# Patient Record
Sex: Male | Born: 1960 | Race: White | Hispanic: No | Smoking: Former smoker
Health system: Southern US, Community
[De-identification: ages and names within clinical notes are randomized; demographics above are authoritative.]

## PROBLEM LIST (undated history)

## (undated) DIAGNOSIS — C649 Malignant neoplasm of unspecified kidney, except renal pelvis: Secondary | ICD-10-CM

## (undated) HISTORY — PX: TONSILLECTOMY: SUR1361

---

## 2005-11-26 ENCOUNTER — Ambulatory Visit: Payer: Self-pay | Admitting: Urology

## 2006-06-30 ENCOUNTER — Ambulatory Visit: Payer: Self-pay | Admitting: Urology

## 2006-07-27 ENCOUNTER — Ambulatory Visit: Payer: Self-pay | Admitting: Urology

## 2006-09-17 ENCOUNTER — Ambulatory Visit: Payer: Self-pay | Admitting: Urology

## 2006-12-17 ENCOUNTER — Ambulatory Visit: Payer: Self-pay | Admitting: Urology

## 2007-06-30 ENCOUNTER — Ambulatory Visit: Payer: Self-pay | Admitting: Urology

## 2007-12-13 IMAGING — CT CT ABDOMEN WO/W CM
2 of 4 series · 14 of 32 positions shown, 19 images · non-contrast
Comparison: none

REASON FOR EXAM: 6 mo f/u renal cell carcinoma
COMMENTS:

[Series 2: soft tissue w/o · axial · non-contrast · 0.78mm/px · z∈[-708,-468]mm · 7 of 41 slices shown, 12 images]
[im 6/41  soft-tissue]
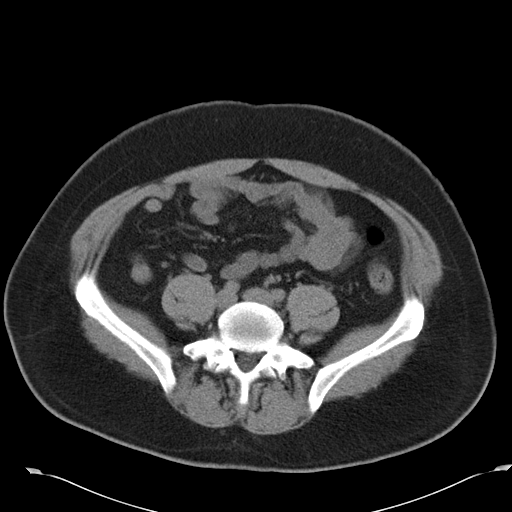
[im 6/41  bone]
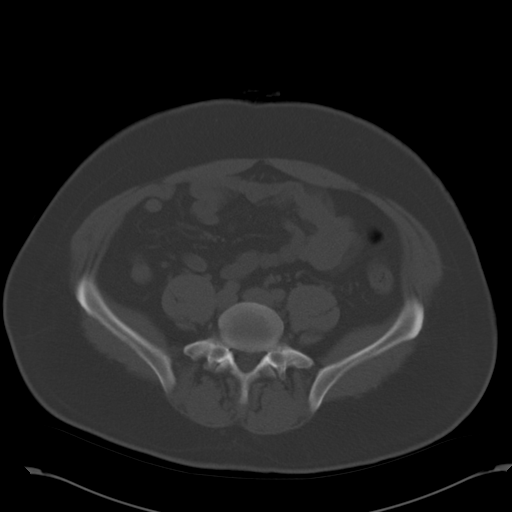
[im 11/41  soft-tissue]
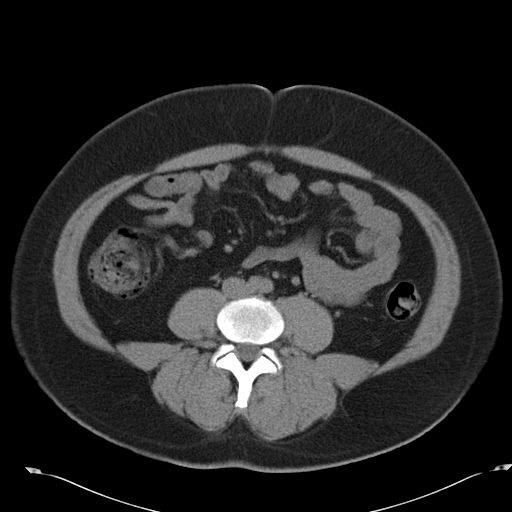
[im 16/41  soft-tissue]
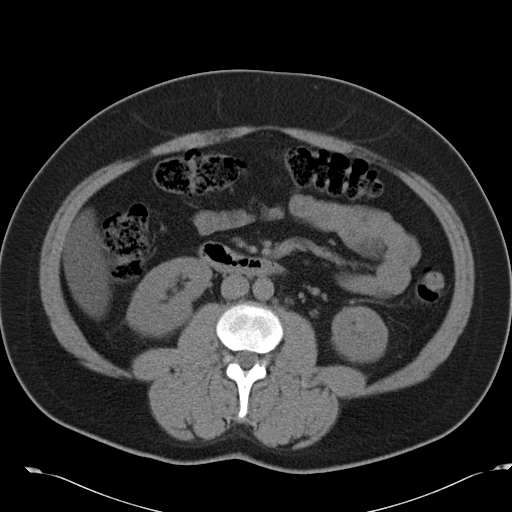
[im 21/41  soft-tissue]
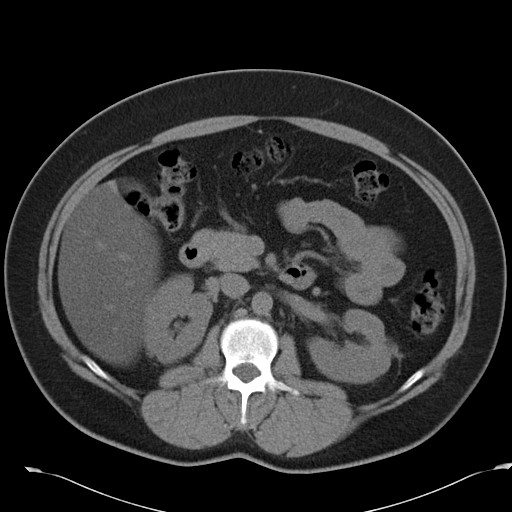
[im 21/41  lung]
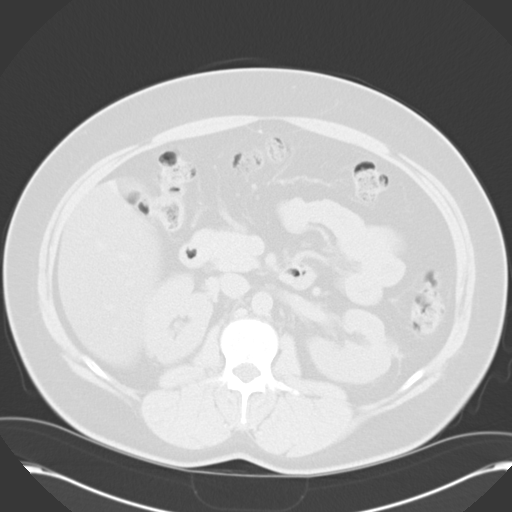
[im 26/41  soft-tissue]
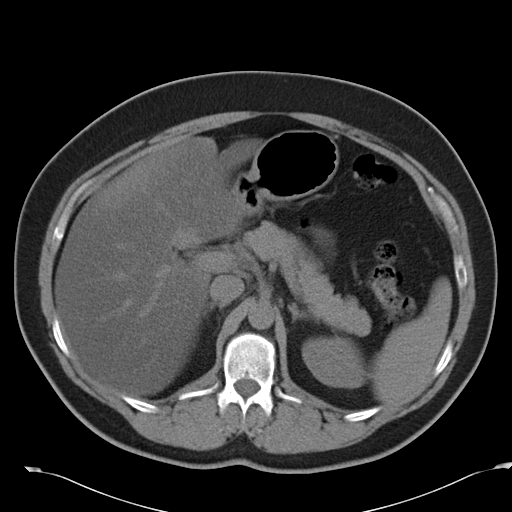
[im 26/41  lung]
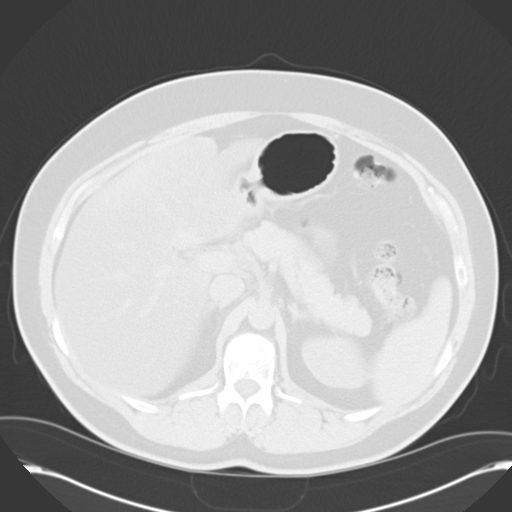
[im 31/41  soft-tissue]
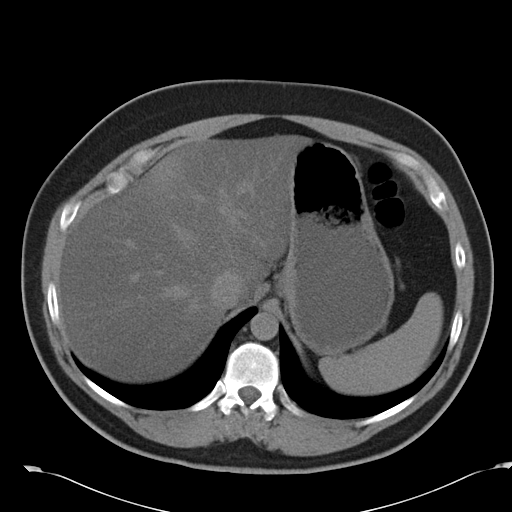
[im 31/41  lung]
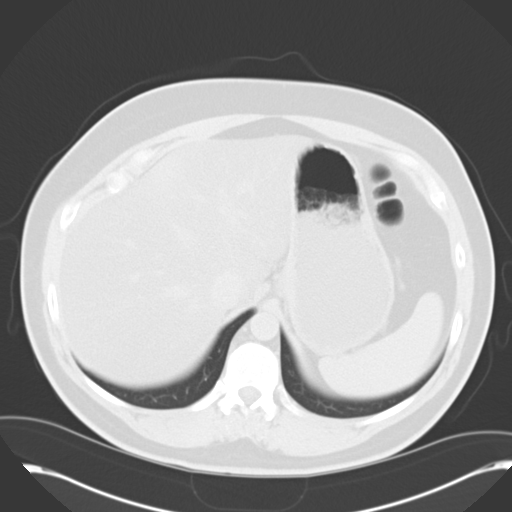
[im 36/41  soft-tissue]
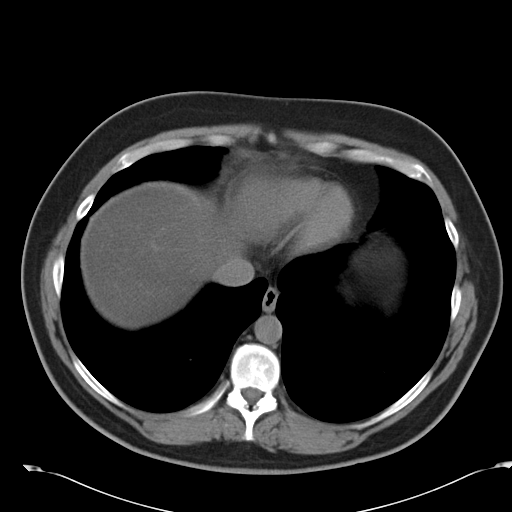
[im 36/41  lung]
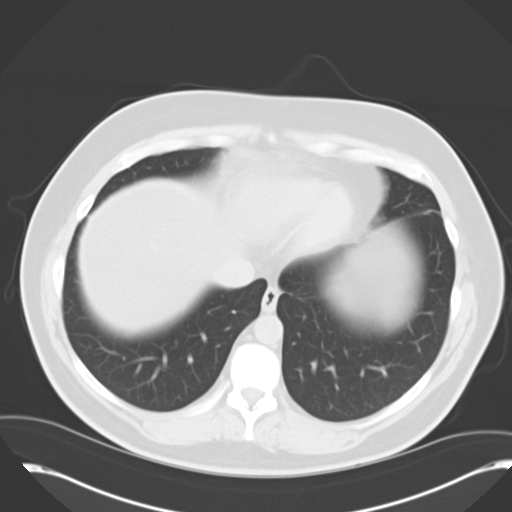

[Series 4: soft tissue with · axial · 0.78mm/px · z∈[-708,-468]mm · 7 of 41 slices shown]
[im 6/41  soft-tissue]
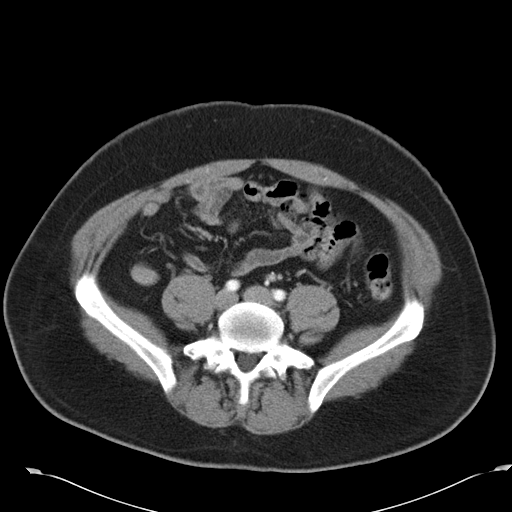
[im 11/41  soft-tissue]
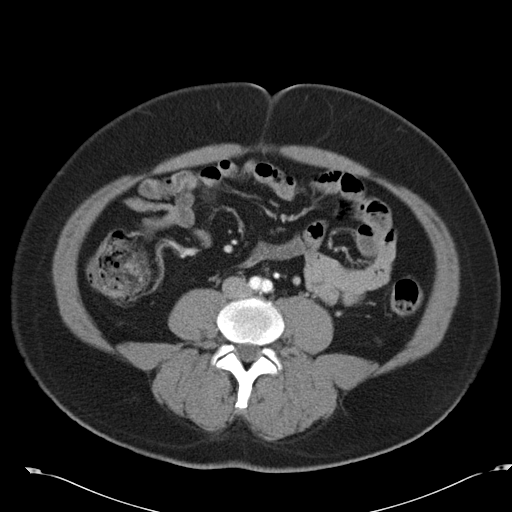
[im 16/41  soft-tissue]
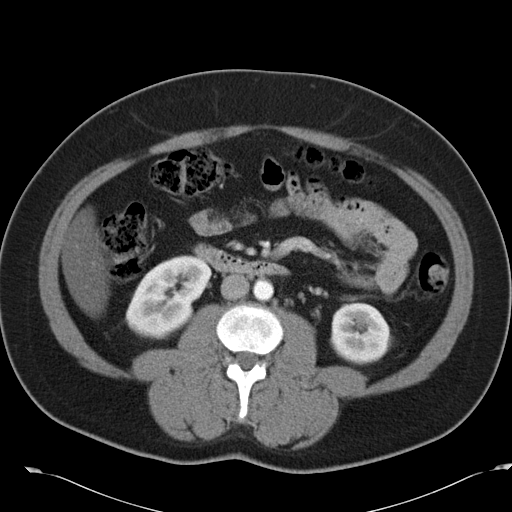
[im 21/41  soft-tissue]
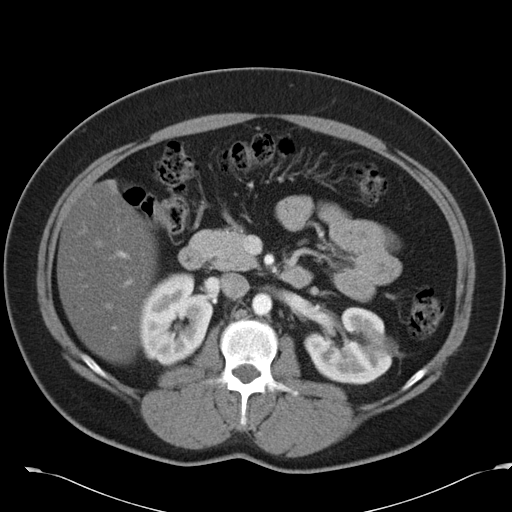
[im 26/41  soft-tissue]
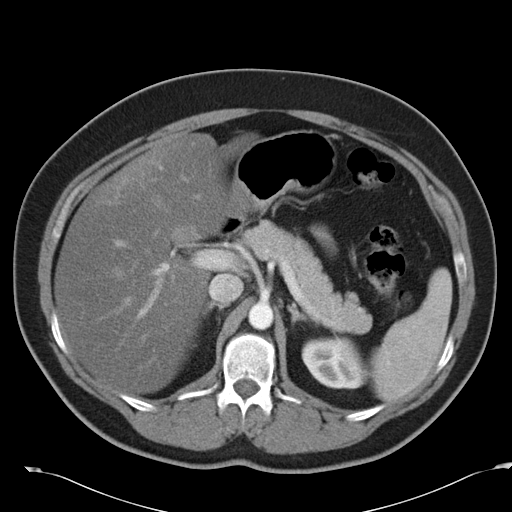
[im 31/41  soft-tissue]
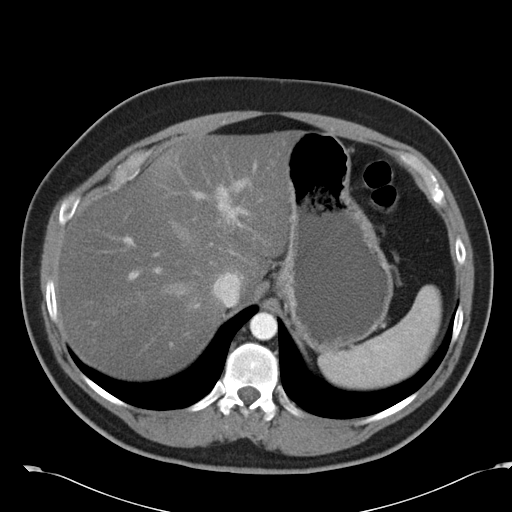
[im 36/41  soft-tissue]
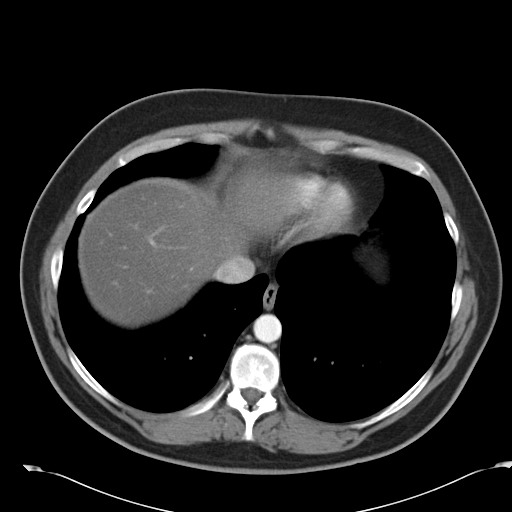

[14 of 32 positions shown; findings below may reference images not displayed]

PROCEDURE:     CT  - CT ABDOMEN STANDARD W/WO  - June 30, 2007  [DATE]

RESULT:     Triphasic CT of the abdomen is compared to the most recent exam
dated 12/17/2006. Comparison is also made images of 09/17/2006. The exophytic
lesion in the midportion of the LEFT kidney on image 21 appears to be
smaller than that seen previously. There is no evidence of enhancement.
There is some adjacent fibrotic stranding. No new renal masses are evident.
There is no hydronephrosis. The kidneys otherwise appear to enhance
normally. There is fatty infiltration the liver. The spleen, pancreas and
adrenal glands are unremarkable. The abdominal aorta is normal in caliber.
There is no abnormal bowel distention. The lung bases are clear. No abnormal
calcification is seen within the kidneys.
IMPRESSION: 1. No evidence of recurrent malignancy.
2. Fatty infiltration of the liver.
3. Post ablation changes in the LEFT kidney as described.

## 2008-01-18 ENCOUNTER — Ambulatory Visit: Payer: Self-pay | Admitting: Urology

## 2008-07-02 IMAGING — US US RENAL KIDNEY
1 series · 17 of 25 positions shown · non-contrast
Comparison: none

REASON FOR EXAM: renal cell CA
COMMENTS:

[Series 1: us renal kidney · 17 of 31 slices shown]
[im 1/31]
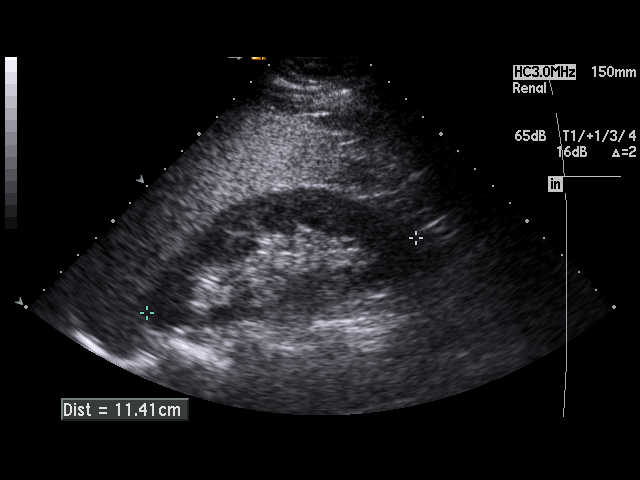
[im 3/31]
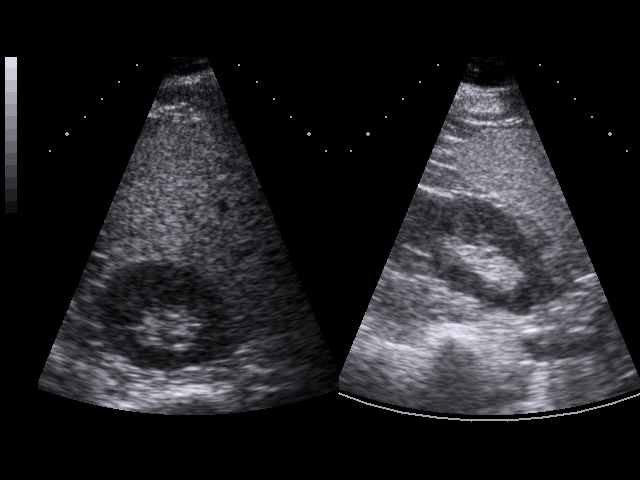
[im 4/31]
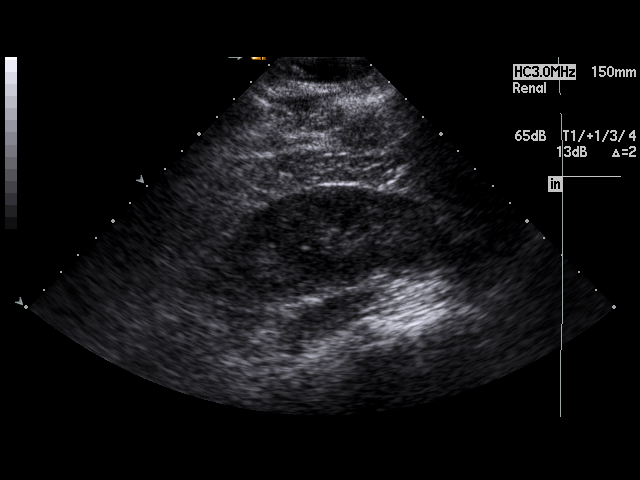
[im 7/31]
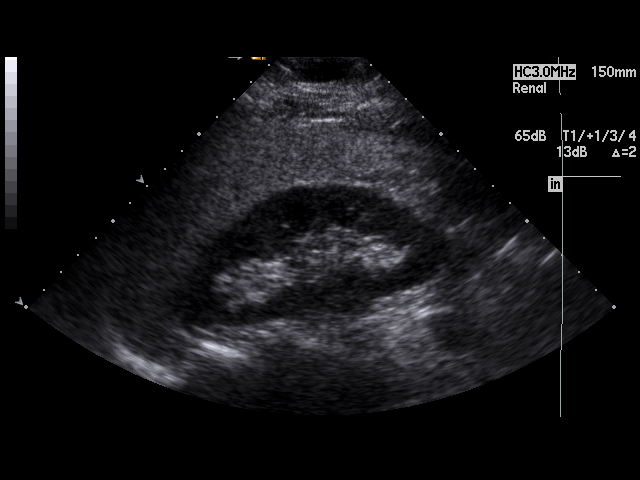
[im 8/31]
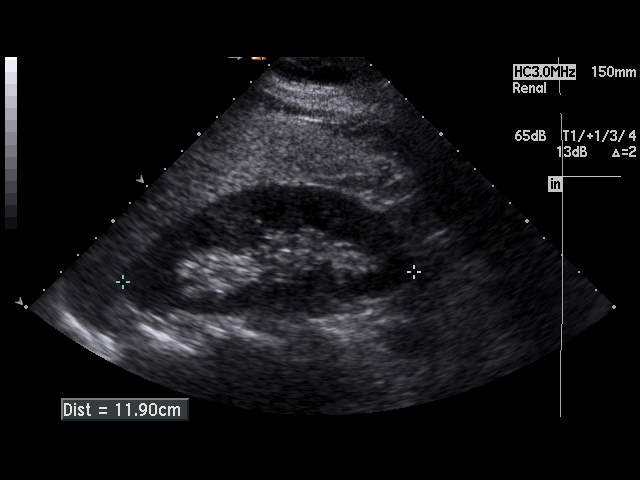
[im 11/31]
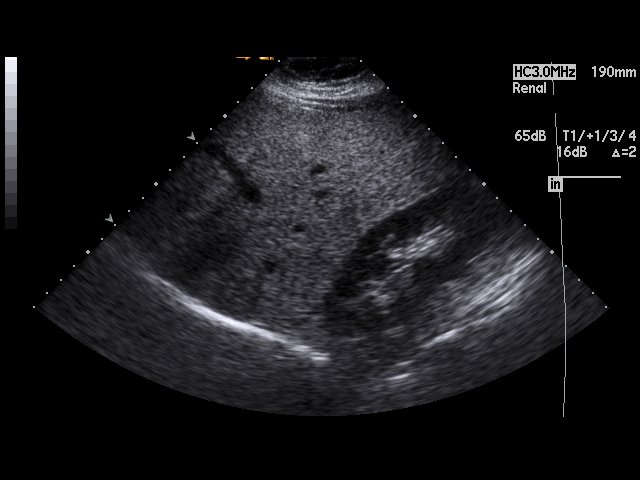
[im 12/31]
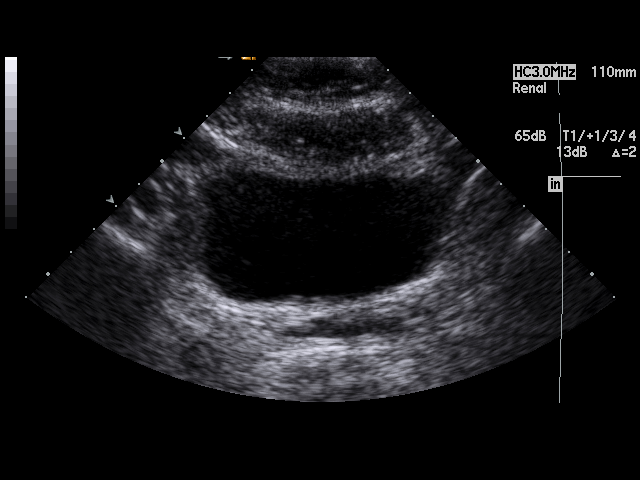
[im 14/31]
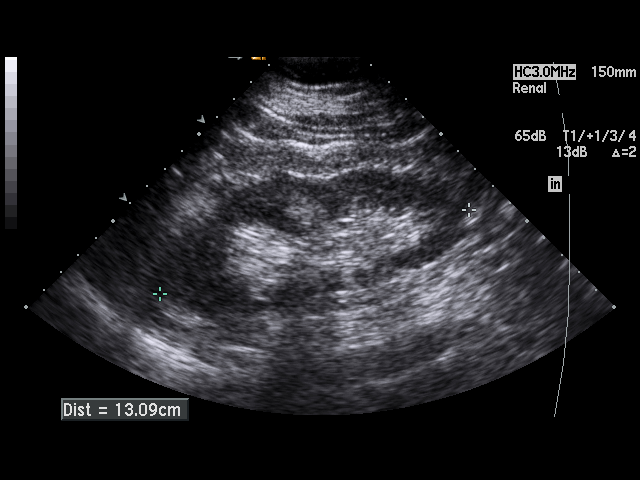
[im 16/31]
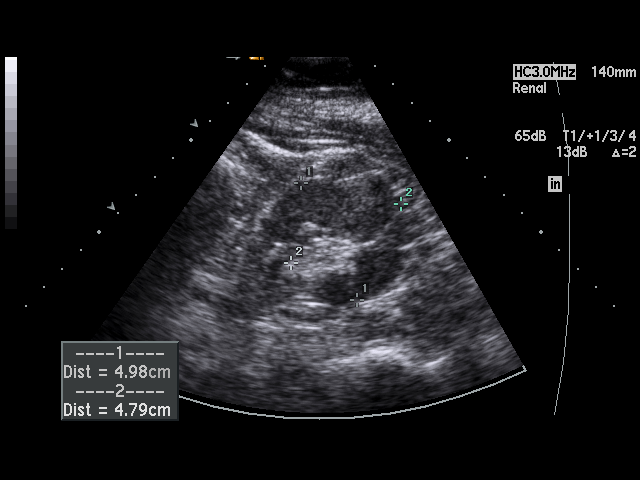
[im 17/31]
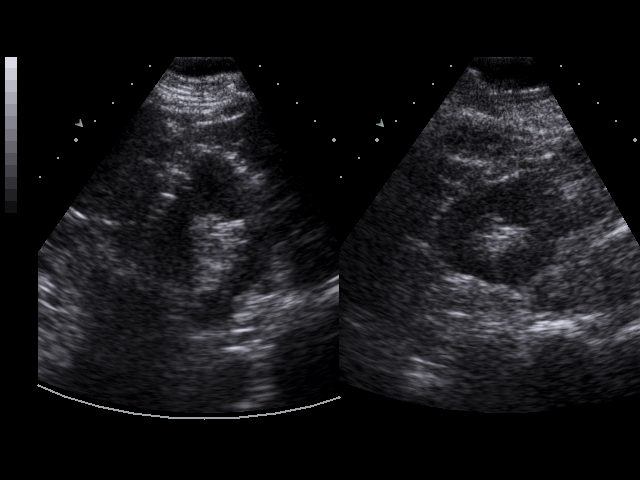
[im 19/31]
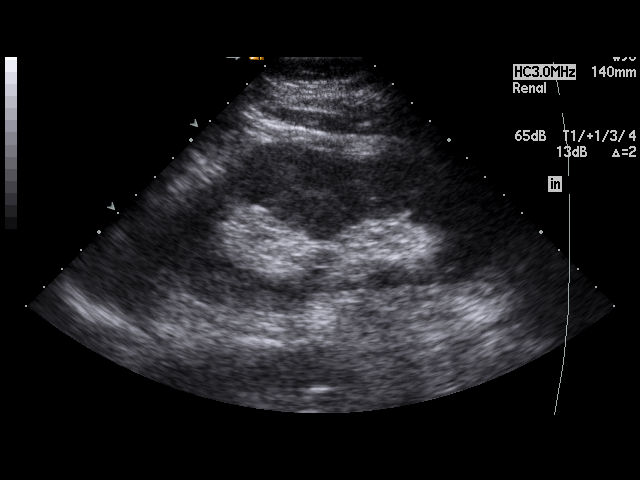
[im 21/31]
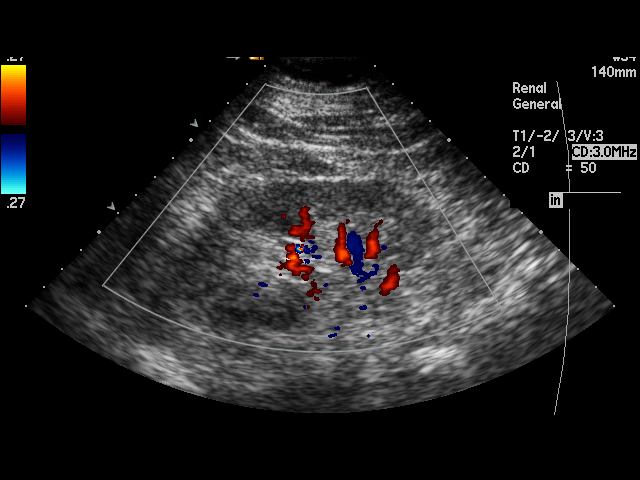
[im 23/31]
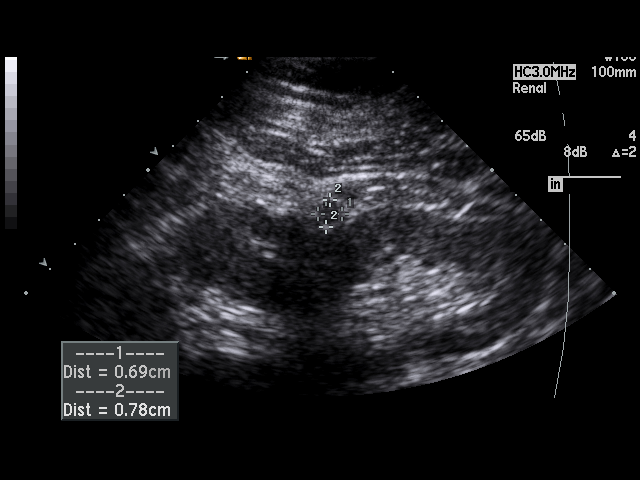
[im 24/31]
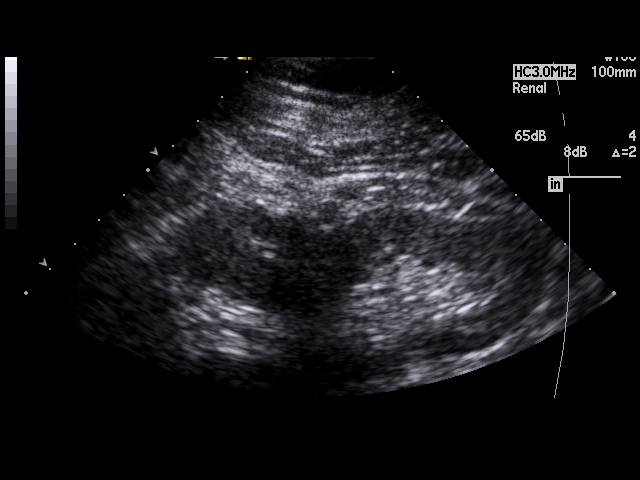
[im 27/31]
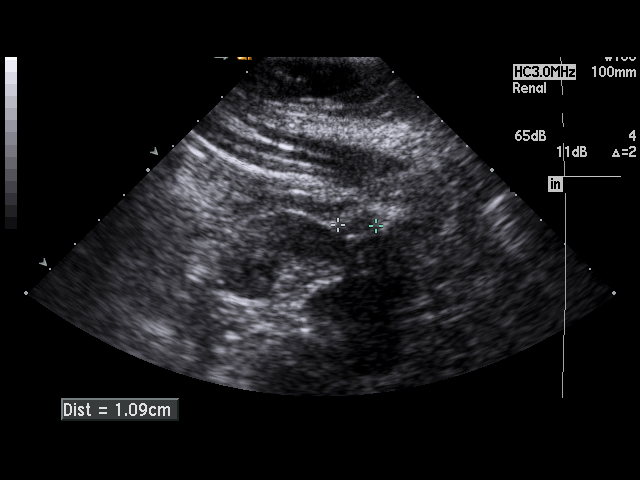
[im 28/31]
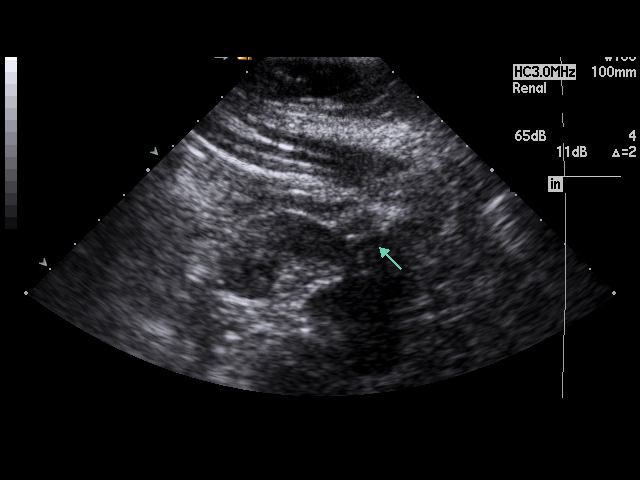
[im 31/31]
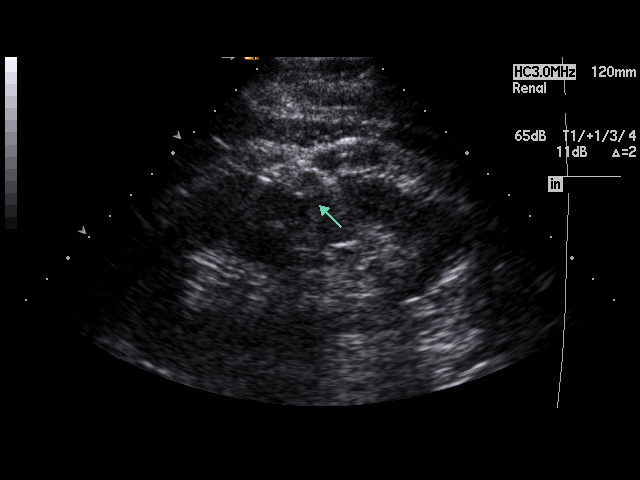

[17 of 25 positions shown; findings below may reference images not displayed]

PROCEDURE:     US  - US KIDNEY  - January 18, 2008 [DATE]

RESULT:     The patient has undergone prior cryoablation for a renal cell
carcinoma involving the LEFT kidney. On the LEFT there does remain area of
subtle decreased echogenicity closely applied to the kidney. This area
measures approximately 7 x 8 x 11 mm in diameter. It does not appear
hypervascular. The LEFT kidney overall measures 13.3 x 5.0 x 4.8 cm.

The RIGHT kidney is normal in echotexture and size measuring 11.9 x 4.7 x
5.1 cm. The partially distended urinary bladder is grossly normal.
IMPRESSION: 1.  There is a small amount of hypoechoic soft tissue adjacent to the LEFT
kidney in the region of the prior cryoablation. When compared to the CT
images from 30 June, 2007 I suspect that there has not been significant
interval change. Direct comparison with a followup CT scan could be
considered as well.
2. I see no abnormality of the RIGHT kidney.

## 2010-01-16 ENCOUNTER — Ambulatory Visit: Payer: Self-pay | Admitting: Urology

## 2010-04-08 ENCOUNTER — Ambulatory Visit: Payer: Self-pay | Admitting: Family Medicine

## 2011-08-21 ENCOUNTER — Ambulatory Visit: Payer: Self-pay | Admitting: Internal Medicine

## 2011-11-05 ENCOUNTER — Ambulatory Visit: Payer: Self-pay

## 2012-08-08 ENCOUNTER — Ambulatory Visit: Payer: Self-pay | Admitting: Emergency Medicine

## 2012-08-08 LAB — URINALYSIS, COMPLETE
Bacteria: NEGATIVE
Bilirubin,UR: NEGATIVE
Glucose,UR: NEGATIVE mg/dL (ref 0–75)
Ketone: NEGATIVE
Nitrite: NEGATIVE
Ph: 6.5 (ref 4.5–8.0)
Protein: NEGATIVE
Specific Gravity: 1.02 (ref 1.003–1.030)

## 2013-07-17 ENCOUNTER — Ambulatory Visit: Payer: Self-pay

## 2013-12-28 ENCOUNTER — Ambulatory Visit: Payer: Self-pay | Admitting: Physician Assistant

## 2015-05-20 ENCOUNTER — Ambulatory Visit
Admission: EM | Admit: 2015-05-20 | Discharge: 2015-05-20 | Disposition: A | Discharge status: Home / Self Care | Payer: BC Managed Care – PPO | Attending: Family Medicine | Admitting: Family Medicine

## 2015-05-20 ENCOUNTER — Encounter: Payer: Self-pay | Admitting: Emergency Medicine

## 2015-05-20 DIAGNOSIS — Z87891 Personal history of nicotine dependence: Secondary | ICD-10-CM | POA: Insufficient documentation

## 2015-05-20 DIAGNOSIS — Z79899 Other long term (current) drug therapy: Secondary | ICD-10-CM | POA: Diagnosis not present

## 2015-05-20 DIAGNOSIS — M549 Dorsalgia, unspecified: Secondary | ICD-10-CM | POA: Insufficient documentation

## 2015-05-20 DIAGNOSIS — N23 Unspecified renal colic: Secondary | ICD-10-CM

## 2015-05-20 DIAGNOSIS — Z85528 Personal history of other malignant neoplasm of kidney: Secondary | ICD-10-CM | POA: Insufficient documentation

## 2015-05-20 DIAGNOSIS — Z87442 Personal history of urinary calculi: Secondary | ICD-10-CM | POA: Diagnosis not present

## 2015-05-20 DIAGNOSIS — R319 Hematuria, unspecified: Secondary | ICD-10-CM | POA: Diagnosis not present

## 2015-05-20 HISTORY — DX: Malignant neoplasm of unspecified kidney, except renal pelvis: C64.9

## 2015-05-20 LAB — URINALYSIS COMPLETE WITH MICROSCOPIC (ARMC ONLY)
Bacteria, UA: NONE SEEN — AB
Glucose, UA: NEGATIVE mg/dL
KETONES UR: NEGATIVE mg/dL
Leukocytes, UA: NEGATIVE
Nitrite: NEGATIVE
SQUAMOUS EPITHELIAL / LPF: NONE SEEN — AB
Specific Gravity, Urine: 1.025 (ref 1.005–1.030)
pH: 5.5 (ref 5.0–8.0)

## 2015-05-20 MED ORDER — TAMSULOSIN HCL 0.4 MG PO CAPS: 0.4000 mg | ORAL_CAPSULE | Freq: Every day | ORAL | Status: DC

## 2015-05-20 MED ORDER — ONDANSETRON HCL 4 MG PO TABS: ORAL_TABLET | ORAL | Status: DC

## 2015-05-20 MED ORDER — KETOROLAC TROMETHAMINE 60 MG/2ML IM SOLN
60.0000 mg | Freq: Once | INTRAMUSCULAR | Status: AC
  Administered 2015-05-20: 60 mg via INTRAMUSCULAR

## 2015-05-20 MED ORDER — IBUPROFEN 800 MG PO TABS: 800.0000 mg | ORAL_TABLET | Freq: Four times a day (QID) | ORAL | Status: DC | PRN

## 2015-05-20 MED ORDER — OXYCODONE-ACETAMINOPHEN 5-325 MG PO TABS: ORAL_TABLET | ORAL | Status: DC

## 2015-05-20 NOTE — ED Provider Notes (Signed)
CSN: [id number]     Arrival date & time 05/20/15  1242 History   First MD Initiated Contact with Patient 05/20/15 1319     Chief Complaint  Patient presents with  . Back Pain   (Consider location/radiation/quality/duration/timing/severity/associated sxs/prior Treatment) HPI  54 yo M awoke early morning with nagging discomfort right flank- no nausea no vomiting. History of known kidney stones -unsure whether R/L/B ?? Having  Right sided pain from mid flank to RLQ, denies any scrotal or testicular pain. Had a kidney cancer on the left in 2007 treated with cryoablation -now with scarring. Followed by Dr Jacqlyn Larsen at [facility]. Took 800 mg Advil at early AM. Fell back to sleep, awoke and ate breakfast. Peristaltic colic pattern has returned and increased over past few hours. Wife is here with patient, Past Medical History  Diagnosis Date  . Renal cell carcinoma    Past Surgical History  Procedure Laterality Date  . Tonsillectomy     History reviewed. No pertinent family history. History  Substance Use Topics  . Smoking status: Former Research scientist (life sciences)  . Smokeless tobacco: Never Used  . Alcohol Use: No    Review of Systems  Review of 10 systems negative for acute change except as referenced in HPI  Allergies  Review of patient's allergies indicates no known allergies.  Home Medications   Prior to Admission medications   Medication Sig Start Date End Date Taking? Authorizing Provider  ibuprofen (ADVIL,MOTRIN) 800 MG tablet Take 1 tablet (800 mg total) by mouth every 6 (six) hours as needed. 05/20/15   Jan Fireman, PA-C  ondansetron (ZOFRAN) 4 MG tablet 1-2  TABLETS BY MOUTH EVERY 6-8 HOURS AS NEEDED FOR NAUSEA 05/20/15   Jan Fireman, PA-C  oxyCODONE-acetaminophen (PERCOCET/ROXICET) 5-325 MG per tablet Take 1-2 tablets by mouth every 6 hours as needed for pain 05/20/15   Jan Fireman, PA-C  tamsulosin (FLOMAX) 0.4 MG CAPS capsule Take 1 capsule (0.4 mg total) by mouth daily. 05/20/15   Jan Fireman, PA-C    BP 145/83 mmHg  Pulse 65  Temp(Src) 98.4 F (36.9 C)  Resp 16  Ht 5\' 10"  (1.778 m)  Wt 215 lb (97.523 kg)  BMI 30.85 kg/m2  SpO2 98% Physical Exam Constitutional -alert and oriented,well appearing and mild distress. Pacing or changing position on the bed Head-atraumatic Eyes- conjunctiva normal, EOMI ,conjugate gaze Nose- no congestion or rhinorrhea Mouth/throat- mucous membranes moist , Neck- supple  CV- regular rate, grossly normal heart sounds,  Resp-no distress, normal respiratory effort,clear to auscultation bilaterally Back- generally non tender, mild increase Right without specific CVAT; peristaltic pain on right GI- soft,non-tender,no distention GU- not examined MSK- no lower extremity tenderness nor edema,no joint effusion, ambulatory Neuro- normal speech and language, no gross focal neurological deficit Skin-warm,dry ,intact; no rash noted Psych-mood and affect grossly normal; speech and behavior grossly normal.   ED Course  Procedures (including critical care time) Labs Review Labs Reviewed  URINALYSIS COMPLETEWITH MICROSCOPIC ([facility] ONLY) - Abnormal; Notable for the following:    APPearance CLOUDY (*)    Bilirubin Urine 1+ (*)    Hgb urine dipstick 3+ (*)    Protein, ur TRACE (*)    Bacteria, UA NONE SEEN (*)    Squamous Epithelial / LPF NONE SEEN (*)    All other components within normal limits   Results for orders placed or performed during the hospital encounter of 05/20/15  Urinalysis complete, with microscopic  Result Value Ref Range   Color, Urine  YELLOW YELLOW   APPearance CLOUDY (A) CLEAR   Glucose, UA NEGATIVE NEGATIVE mg/dL   Bilirubin Urine 1+ (A) NEGATIVE   Ketones, ur NEGATIVE NEGATIVE mg/dL   Specific Gravity, Urine 1.025 1.005 - 1.030   Hgb urine dipstick 3+ (A) NEGATIVE   pH 5.5 5.0 - 8.0   Protein, ur TRACE (A) NEGATIVE mg/dL   Nitrite NEGATIVE NEGATIVE   Leukocytes, UA NEGATIVE NEGATIVE   RBC / HPF TOO NUMEROUS TO COUNT <3 RBC/hpf    WBC, UA 0-5 <3 WBC/hpf   Bacteria, UA NONE SEEN (A) RARE   Squamous Epithelial / LPF NONE SEEN (A) RARE   Mucous PRESENT    Ca Oxalate Crys, UA PRESENT     Imaging Review No results found. Medications  ketorolac (TORADOL) injection 60 mg (60 mg Intramuscular Given 05/20/15 1344)   Much improved after RX - relaxed Drinking water , comfortable MDM   1. Hematuria   2. Personal history of kidney stones   3. Colic, ureteral    Discharge Medication List as of 05/20/2015  2:12 PM    START taking these medications   Details  ibuprofen (ADVIL,MOTRIN) 800 MG tablet Take 1 tablet (800 mg total) by mouth every 6 (six) hours as needed., Starting 05/20/2015, Until Discontinued, Print    ondansetron (ZOFRAN) 4 MG tablet 1-2  TABLETS BY MOUTH EVERY 6-8 HOURS AS NEEDED FOR NAUSEA, Print    oxyCODONE-acetaminophen (PERCOCET/ROXICET) 5-325 MG per tablet Take 1-2 tablets by mouth every 6 hours as needed for pain, Print    tamsulosin (FLOMAX) 0.4 MG CAPS capsule Take 1 capsule (0.4 mg total) by mouth daily., Starting 05/20/2015, Until Discontinued, Print       Plan: 1. Test results and diagnosis reviewed with patient 2. Rx as per orders; risks, benefits, potential side effects reviewed with patient 3. Recommend supportive treatment with NSAIDs as well as Percocet 4. F/u prn if symptoms worsen or don't improve-discussed plan to go to [facility] ER if exacerbation of discomfort. He is due for annual follow up with Urology and due a CT- appropriate to use system he has his history in and with attendings who know him, if this colic doesn't resolve . They live in the area and are very comfortable with the plan 5. Medications as above.  Jan Fireman, PA-C 05/20/15 1507

## 2015-05-20 NOTE — Discharge Instructions (Signed)
Kidney Stones °Kidney stones (urolithiasis) are deposits that form inside your kidneys. The intense pain is caused by the stone moving through the urinary tract. When the stone moves, the ureter goes into spasm around the stone. The stone is usually passed in the urine.  °CAUSES  °· A disorder that makes certain neck glands produce too much parathyroid hormone (primary hyperparathyroidism). °· A buildup of uric acid crystals, similar to gout in your joints. °· Narrowing (stricture) of the ureter. °· A kidney obstruction present at birth (congenital obstruction). °· Previous surgery on the kidney or ureters. °· Numerous kidney infections. °SYMPTOMS  °· Feeling sick to your stomach (nauseous). °· Throwing up (vomiting). °· Blood in the urine (hematuria). °· Pain that usually spreads (radiates) to the groin. °· Frequency or urgency of urination. °DIAGNOSIS  °· Taking a history and physical exam. °· Blood or urine tests. °· CT scan. °· Occasionally, an examination of the inside of the urinary bladder (cystoscopy) is performed. °TREATMENT  °· Observation. °· Increasing your fluid intake. °· Extracorporeal shock wave lithotripsy--This is a noninvasive procedure that uses shock waves to break up kidney stones. °· Surgery may be needed if you have severe pain or persistent obstruction. There are various surgical procedures. Most of the procedures are performed with the use of small instruments. Only small incisions are needed to accommodate these instruments, so recovery time is minimized. °The size, location, and chemical composition are all important variables that will determine the proper choice of action for you. Talk to your health care provider to better understand your situation so that you will minimize the risk of injury to yourself and your kidney.  °HOME CARE INSTRUCTIONS  °· Drink enough water and fluids to keep your urine clear or pale yellow. This will help you to pass the stone or stone fragments. °· Strain  all urine through the provided strainer. Keep all particulate matter and stones for your health care provider to see. The stone causing the pain may be as small as a grain of salt. It is very important to use the strainer each and every time you pass your urine. The collection of your stone will allow your health care provider to analyze it and verify that a stone has actually passed. The stone analysis will often identify what you can do to reduce the incidence of recurrences. °· Only take over-the-counter or prescription medicines for pain, discomfort, or fever as directed by your health care provider. °· Make a follow-up appointment with your health care provider as directed. °· Get follow-up X-rays if required. The absence of pain does not always mean that the stone has passed. It may have only stopped moving. If the urine remains completely obstructed, it can cause loss of kidney function or even complete destruction of the kidney. It is your responsibility to make sure X-rays and follow-ups are completed. Ultrasounds of the kidney can show blockages and the status of the kidney. Ultrasounds are not associated with any radiation and can be performed easily in a matter of minutes. °SEEK MEDICAL CARE IF: °· You experience pain that is progressive and unresponsive to any pain medicine you have been prescribed. °SEEK IMMEDIATE MEDICAL CARE IF:  °· Pain cannot be controlled with the prescribed medicine. °· You have a fever or shaking chills. °· The severity or intensity of pain increases over 18 hours and is not relieved by pain medicine. °· You develop a new onset of abdominal pain. °· You feel faint or pass out. °·   You are unable to urinate. MAKE SURE YOU:   Understand these instructions.  Will watch your condition.  Will get help right away if you are not doing well or get worse. Document Released: 11/16/2005 Document Revised: 07/19/2013 Document Reviewed: 04/19/2013 [facility] Patient Information 2015  [place], [place]. This information is not intended to replace advice given to you by your health care provider. Make sure you discuss any questions you have with your health care provider. Lithotripsy for Kidney Stones Lithotripsy is a treatment that can sometimes help eliminate kidney stones and pain that they cause. A form of lithotripsy, also known as extracorporeal shock wave lithotripsy, is a nonsurgical procedure that helps your body rid itself of the kidney stone when it is too big to pass on its own. Extracorporeal shock wave lithotripsy is a method of crushing a kidney stone with shock waves. These shock waves pass through your body and are focused on your stone. They cause the kidney stones to crumble while still in the urinary tract. It is then easier for the smaller pieces of stone to pass in the urine. Lithotripsy usually takes about an hour. It is done in a hospital, a lithotripsy center, or a mobile unit. It usually does not require an overnight stay. Your health care provider will instruct you on preparation for the procedure. Your health care provider will tell you what to expect afterward. LET [facility] CARE PROVIDER KNOW ABOUT:  Any allergies you have.  All medicines you are taking, including vitamins, herbs, eye drops, creams, and over-the-counter medicines.  Previous problems you or members of your family have had with the use of anesthetics.  Any blood disorders you have.  Previous surgeries you have had.  Medical conditions you have. RISKS AND COMPLICATIONS Generally, lithotripsy for kidney stones is a safe procedure. However, as with any procedure, complications can occur. Possible complications include:  Infection.  Bleeding of the kidney.  Bruising of the kidney or skin.  Obstruction of the ureter.  Failure of the stone to fragment. BEFORE THE PROCEDURE  Do not eat or drink for 6-8 hours prior to the procedure. You may, however, take the medications with a sip  of water that your physician instructs you to take  Do not take aspirin or aspirin-containing products for 7 days prior to your procedure  Do not take nonsteroidal anti-inflammatory products for 7 days prior to your procedure PROCEDURE A stent (flexible tube with holes) may be placed in your ureter. The ureter is the tube that transports the urine from the kidneys to the bladder. Your health care provider may place a stent before the procedure. This will help keep urine flowing from the kidney if the fragments of the stone block the ureter. You may have an IV tube placed in one of your veins to give you fluids and medicines. These medicines may help you relax or make you sleep. During the procedure, you will lie comfortably on a fluid-filled cushion or in a warm-water bath. After an X-ray or ultrasound exam to locate your stone, shock waves are aimed at the stone. If you are awake, you may feel a tapping sensation as the shock waves pass through your body. If large stone particles remain after treatment, a second procedure may be necessary at a later date. For comfort during the test:  Relax as much as possible.  Try to remain still as much as possible.  Try to follow instructions to speed up the test.  Let your health care  provider know if you are uncomfortable, anxious, or in pain. AFTER THE PROCEDURE  After surgery, you will be taken to the recovery area. A nurse will watch and check your progress. Once you're awake, stable, and taking fluids well, you will be allowed to go home as long as there are no problems. You will also be allowed to pass your urine before discharge.You may be given antibiotics to help prevent infection. You may also be prescribed pain medicine if needed. In a week or two, your health care provider may remove your stent, if you have one. You may first have an X-ray exam to check on how successful the fragmentation of your stone has been and how much of the stone has  passed. Your health care provider will check to see whether or not stone particles remain. SEEK IMMEDIATE MEDICAL CARE IF:  You develop a fever or shaking chills.  Your pain is not relieved by medicine.  You feel sick to your stomach (nauseated) and you vomit.  You develop heavy bleeding.  You have difficulty urinating.  You start to pass your stent from your penis. Document Released: 11/13/2000 Document Revised: 09/06/2013 Document Reviewed: 06/01/2013 [facility] Patient Information 2015 [place], [place]. This information is not intended to replace advice given to you by your health care provider. Make sure you discuss any questions you have with your health care provider. Dietary Guidelines to Help Prevent Kidney Stones Your risk of kidney stones can be decreased by adjusting the foods you eat. The most important thing you can do is drink enough fluid. You should drink enough fluid to keep your urine clear or pale yellow. The following guidelines provide specific information for the type of kidney stone you have had. GUIDELINES ACCORDING TO TYPE OF KIDNEY STONE Calcium Oxalate Kidney Stones  Reduce the amount of salt you eat. Foods that have a lot of salt cause your body to release excess calcium into your urine. The excess calcium can combine with a substance called oxalate to form kidney stones.  Reduce the amount of animal protein you eat if the amount you eat is excessive. Animal protein causes your body to release excess calcium into your urine. Ask your dietitian how much protein from animal sources you should be eating.  Avoid foods that are high in oxalates. If you take vitamins, they should have less than 500 mg of vitamin C. Your body turns vitamin C into oxalates. You do not need to avoid fruits and vegetables high in vitamin C. Calcium Phosphate Kidney Stones  Reduce the amount of salt you eat to help prevent the release of excess calcium into your urine.  Reduce the amount  of animal protein you eat if the amount you eat is excessive. Animal protein causes your body to release excess calcium into your urine. Ask your dietitian how much protein from animal sources you should be eating.  Get enough calcium from food or take a calcium supplement (ask your dietitian for recommendations). Food sources of calcium that do not increase your risk of kidney stones include:  Broccoli.  Dairy products, such as cheese and yogurt.  Pudding. Uric Acid Kidney Stones  Do not have more than 6 oz of animal protein per day. FOOD SOURCES Animal Protein Sources  Meat (all types).  Poultry.  Eggs.  Fish, seafood. Foods High in [employer] seasonings.  Soy sauce.  Teriyaki sauce.  Cured and processed meats.  Salted crackers and snack foods.  Fast food.  Canned soups  and most canned foods. Foods High in Oxalates  Grains:  Amaranth.  Barley.  Grits.  Wheat germ.  Bran.  Buckwheat flour.  All bran cereals.  Pretzels.  Whole wheat bread.  Vegetables:  Beans (wax).  Beets and beet greens.  Collard greens.  Eggplant.  [place].  Leeks.  Okra.  Parsley.  Rutabagas.  Spinach.  Swiss chard.  Tomato paste.  Fried potatoes.  Sweet potatoes.  Fruits:  Red currants.  Figs.  Kiwi.  Rhubarb.  Meat and Other Protein Sources:  Beans (dried).  Soy burgers and other soybean products.  Miso.  Nuts (peanuts, almonds, pecans, cashews, hazelnuts).  Nut butters.  Sesame seeds and tahini (paste made of sesame seeds).  Poppy seeds.  Beverages:  Chocolate drink mixes.  Soy milk.  Instant iced tea.  Juices made from high-oxalate fruits or vegetables.  Other:  Carob.  Chocolate.  Fruitcake.  Marmalades. Document Released: 03/13/2011 Document Revised: 11/21/2013 Document Reviewed: 10/13/2013 [facility] Patient Information 2015 [place], [place]. This information is not intended to replace advice given to you by  your health care provider. Make sure you discuss any questions you have with your health care provider.

## 2015-05-20 NOTE — ED Notes (Signed)
Patient c/o lower back back on the right side that radiates to his abdomen that started this morning.  Patient reports some nausea.  Patient denies vomitting.  Patient denies fevers.

## 2024-07-06 ENCOUNTER — Ambulatory Visit
Admission: RE | Admit: 2024-07-06 | Discharge: 2024-07-06 | Disposition: A | Discharge status: Home / Self Care | Payer: Self-pay | Source: Ambulatory Visit | Attending: Physician Assistant | Admitting: Physician Assistant

## 2024-07-06 VITALS — BP 153/93 | HR 90 | Temp 98.4°F | Resp 17

## 2024-07-06 DIAGNOSIS — L0231 Cutaneous abscess of buttock: Secondary | ICD-10-CM

## 2024-07-06 MED ORDER — AMOXICILLIN-POT CLAVULANATE 875-125 MG PO TABS: 1.0000 | ORAL_TABLET | Freq: Two times a day (BID) | ORAL | 0 refills | Status: AC

## 2024-07-06 NOTE — Discharge Instructions (Addendum)
-   You have cellulitis/developing abscess. - There is nothing to drain today.  There is already an opening which is draining some pus. - Sit in warm baths a few times a day to help this continue to drain and start the oral antibiotics.  You should be feeling much better in the next couple of days but if you feel that you are developing a larger abscess that may need to be drained or you are not improving please return or go to ER.

## 2024-07-06 NOTE — ED Provider Notes (Signed)
 [facility]    CSN: [id number] Arrival date & time: 07/06/24  9083      History   Chief Complaint Chief Complaint  Patient presents with   Hemorrhoids    I have an anal abscess and I have a slight fever.  I can tell I have an infection and can't get in to my primary care physician until Monday.  Hoping to get looked at an a prescription for some antibiotics. - Entered by patient    HPI Gilbert Barnett is a 63 y.o. male presenting for 4-5 day history of an area of swelling with occasional pustular drainage on the inner left buttocks. He says this is a chronic problem and he has occasional flare ups of increased pain, swelling. He has had chills and sweats but has been afebrile with temps up to 98.9 degrees. No anal bleeding. Occasional  pain with Bms, but no blood in stool or abdominal pain. No constipation or diarrhea. Has taken Motrin  twice for pain and it has been helpful. Patient believes he needs antibiotics.   HPI  Past Medical History:  Diagnosis Date   Renal cell carcinoma (HCC)     There are no active problems to display for this patient.   Past Surgical History:  Procedure Laterality Date   TONSILLECTOMY         Home Medications    Prior to Admission medications   Medication Sig Start Date End Date Taking? Authorizing Provider  amoxicillin -clavulanate (AUGMENTIN ) 875-125 MG tablet Take 1 tablet by mouth every 12 (twelve) hours for 7 days. 07/06/24 07/13/24 Yes Arvis Jolan NOVAK, PA-C    Family History History reviewed. No pertinent family history.  Social History Social History   Tobacco Use   Smoking status: Former   Smokeless tobacco: Never  Substance Use Topics   Alcohol use: No   Drug use: No     Allergies   Patient has no known allergies.   Review of Systems Review of Systems  Constitutional:  Negative for fatigue and fever.  Gastrointestinal:  Positive for rectal pain. Negative for abdominal pain, blood in stool, constipation,  diarrhea, nausea and vomiting.  Musculoskeletal:  Negative for back pain.  Skin:  Positive for color change.     Physical Exam Triage Vital Signs ED Triage Vitals  Encounter Vitals Group     BP 07/06/24 0950 (!) 153/93     Girls Systolic BP Percentile --      Girls Diastolic BP Percentile --      Boys Systolic BP Percentile --      Boys Diastolic BP Percentile --      Pulse Rate 07/06/24 0950 90     Resp 07/06/24 0950 17     Temp 07/06/24 0950 98.4 F (36.9 C)     Temp Source 07/06/24 0950 Oral     SpO2 07/06/24 0950 99 %     Weight --      Height --      Head Circumference --      Peak Flow --      Pain Score 07/06/24 0949 4     Pain Loc --      Pain Education --      Exclude from Growth Chart --    No data found.  Updated Vital Signs BP (!) 153/93 (BP Location: Left Arm)   Pulse 90   Temp 98.4 F (36.9 C) (Oral)   Resp 17   SpO2 99%   Physical Exam  Vitals and nursing note reviewed. Exam conducted with a chaperone present ([place], MA).  Constitutional:      General: He is not in acute distress.    Appearance: Normal appearance. He is well-developed. He is not ill-appearing.  HENT:     Head: Normocephalic and atraumatic.  Eyes:     General: No scleral icterus.    Conjunctiva/sclera: Conjunctivae normal.  Cardiovascular:     Rate and Rhythm: Normal rate.  Pulmonary:     Effort: Pulmonary effort is normal. No respiratory distress.  Abdominal:     Palpations: Abdomen is soft.     Tenderness: There is no abdominal tenderness.  Genitourinary:     Comments: There is an area of induration/erythema 1 in x 0.5 in of the left inner buttocks. No fluctuance. Thre is a tiny opening above the abscess with pustular drainage. Area is TTP. Musculoskeletal:     Cervical back: Neck supple.  Skin:    General: Skin is warm and dry.     Capillary Refill: Capillary refill takes less than 2 seconds.  Neurological:     General: No focal deficit present.     Mental Status: He  is alert. Mental status is at baseline.     Motor: No weakness.     Gait: Gait normal.  Psychiatric:        Mood and Affect: Mood normal.        Behavior: Behavior normal.      UC Treatments / Results  Labs (all labs ordered are listed, but only abnormal results are displayed) Labs Reviewed - No data to display  EKG   Radiology No results found.  Procedures Procedures (including critical care time)  Medications Ordered in UC Medications - No data to display  Initial Impression / Assessment and Plan / UC Course  I have reviewed the triage vital signs and the nursing notes.  Pertinent labs & imaging results that were available during my care of the patient were reviewed by me and considered in my medical decision making (see chart for details).   63 y/o male presents for abscess of left buttocks with pustular drainage for a few days. This is a recurrent issue. No history of MRSA and this is the first time he has been evaluated by a clinician for this condition.   Developing buttock abscess. Not ready for I&D. Sent Augmentin  to pharmacy. Advised warm soaks. Reviewed returning if symptoms worsen or are not improving in a couple days.    Final Clinical Impressions(s) / UC Diagnoses   Final diagnoses:  Left buttock abscess     Discharge Instructions      - You have cellulitis/developing abscess. - There is nothing to drain today.  There is already an opening which is draining some pus. - Sit in warm baths a few times a day to help this continue to drain and start the oral antibiotics.  You should be feeling much better in the next couple of days but if you feel that you are developing a larger abscess that may need to be drained or you are not improving please return or go to ER.     ED Prescriptions     Medication Sig Dispense Auth. Provider   amoxicillin -clavulanate (AUGMENTIN ) 875-125 MG tablet Take 1 tablet by mouth every 12 (twelve) hours for 7 days. 14 tablet  Sanjay Broadfoot B, PA-C      PDMP not reviewed this encounter.   Arvis Jolan NOVAK, PA-C 07/06/24 1119

## 2024-07-06 NOTE — ED Triage Notes (Addendum)
  I have an anal abscess and I have a slight fever.  I can tell I have an infection and can't get in to my primary care physician until Monday.  Hoping to get looked at an a prescription for some antibiotics    Patient states that he's had this problem for over a year. States that it comes and goes. Patient states that abscess-hemorrhoid got worse about 4-5 days ago.   [Patient taking IBU last dose 730 this morning
